# Patient Record
Sex: Male | Born: 1940
Health system: Southern US, Community
[De-identification: ages and names within clinical notes are randomized; demographics above are authoritative.]

## PROBLEM LIST (undated history)

## (undated) DIAGNOSIS — I10 Essential (primary) hypertension: Secondary | ICD-10-CM

---

## 2013-09-23 DIAGNOSIS — E785 Hyperlipidemia, unspecified: Secondary | ICD-10-CM | POA: Diagnosis not present

## 2013-09-23 DIAGNOSIS — I10 Essential (primary) hypertension: Secondary | ICD-10-CM | POA: Diagnosis not present

## 2013-12-17 DIAGNOSIS — L259 Unspecified contact dermatitis, unspecified cause: Secondary | ICD-10-CM | POA: Diagnosis not present

## 2013-12-17 DIAGNOSIS — I10 Essential (primary) hypertension: Secondary | ICD-10-CM | POA: Diagnosis not present

## 2013-12-17 DIAGNOSIS — K130 Diseases of lips: Secondary | ICD-10-CM | POA: Diagnosis not present

## 2014-03-18 DIAGNOSIS — M5412 Radiculopathy, cervical region: Secondary | ICD-10-CM | POA: Diagnosis not present

## 2014-04-01 DIAGNOSIS — H43819 Vitreous degeneration, unspecified eye: Secondary | ICD-10-CM | POA: Diagnosis not present

## 2014-04-01 DIAGNOSIS — H2589 Other age-related cataract: Secondary | ICD-10-CM | POA: Diagnosis not present

## 2014-06-06 DIAGNOSIS — Z23 Encounter for immunization: Secondary | ICD-10-CM | POA: Diagnosis not present

## 2014-06-06 DIAGNOSIS — K130 Diseases of lips: Secondary | ICD-10-CM | POA: Diagnosis not present

## 2014-11-14 DIAGNOSIS — I10 Essential (primary) hypertension: Secondary | ICD-10-CM | POA: Diagnosis not present

## 2014-11-14 DIAGNOSIS — Z6825 Body mass index (BMI) 25.0-25.9, adult: Secondary | ICD-10-CM | POA: Diagnosis not present

## 2014-11-14 DIAGNOSIS — Z125 Encounter for screening for malignant neoplasm of prostate: Secondary | ICD-10-CM | POA: Diagnosis not present

## 2014-11-14 DIAGNOSIS — E785 Hyperlipidemia, unspecified: Secondary | ICD-10-CM | POA: Diagnosis not present

## 2014-11-14 DIAGNOSIS — H6123 Impacted cerumen, bilateral: Secondary | ICD-10-CM | POA: Diagnosis not present

## 2014-11-14 DIAGNOSIS — Z131 Encounter for screening for diabetes mellitus: Secondary | ICD-10-CM | POA: Diagnosis not present

## 2014-11-14 DIAGNOSIS — Z1389 Encounter for screening for other disorder: Secondary | ICD-10-CM | POA: Diagnosis not present

## 2014-12-15 DIAGNOSIS — D225 Melanocytic nevi of trunk: Secondary | ICD-10-CM | POA: Diagnosis not present

## 2014-12-15 DIAGNOSIS — D1801 Hemangioma of skin and subcutaneous tissue: Secondary | ICD-10-CM | POA: Diagnosis not present

## 2014-12-15 DIAGNOSIS — L820 Inflamed seborrheic keratosis: Secondary | ICD-10-CM | POA: Diagnosis not present

## 2015-04-02 DIAGNOSIS — Z9181 History of falling: Secondary | ICD-10-CM | POA: Diagnosis not present

## 2015-04-02 DIAGNOSIS — Z23 Encounter for immunization: Secondary | ICD-10-CM | POA: Diagnosis not present

## 2015-04-02 DIAGNOSIS — Z6825 Body mass index (BMI) 25.0-25.9, adult: Secondary | ICD-10-CM | POA: Diagnosis not present

## 2015-04-02 DIAGNOSIS — Z125 Encounter for screening for malignant neoplasm of prostate: Secondary | ICD-10-CM | POA: Diagnosis not present

## 2015-04-02 DIAGNOSIS — E785 Hyperlipidemia, unspecified: Secondary | ICD-10-CM | POA: Diagnosis not present

## 2015-04-02 DIAGNOSIS — R5383 Other fatigue: Secondary | ICD-10-CM | POA: Diagnosis not present

## 2015-04-02 DIAGNOSIS — Z0000 Encounter for general adult medical examination without abnormal findings: Secondary | ICD-10-CM | POA: Diagnosis not present

## 2015-04-02 DIAGNOSIS — Z131 Encounter for screening for diabetes mellitus: Secondary | ICD-10-CM | POA: Diagnosis not present

## 2015-04-02 DIAGNOSIS — I10 Essential (primary) hypertension: Secondary | ICD-10-CM | POA: Diagnosis not present

## 2015-04-02 DIAGNOSIS — Z1389 Encounter for screening for other disorder: Secondary | ICD-10-CM | POA: Diagnosis not present

## 2015-04-02 DIAGNOSIS — K130 Diseases of lips: Secondary | ICD-10-CM | POA: Diagnosis not present

## 2015-04-02 DIAGNOSIS — R7309 Other abnormal glucose: Secondary | ICD-10-CM | POA: Diagnosis not present

## 2015-05-28 DIAGNOSIS — H04123 Dry eye syndrome of bilateral lacrimal glands: Secondary | ICD-10-CM | POA: Diagnosis not present

## 2015-05-28 DIAGNOSIS — H43813 Vitreous degeneration, bilateral: Secondary | ICD-10-CM | POA: Diagnosis not present

## 2015-05-28 DIAGNOSIS — H25813 Combined forms of age-related cataract, bilateral: Secondary | ICD-10-CM | POA: Diagnosis not present

## 2015-05-28 DIAGNOSIS — H16143 Punctate keratitis, bilateral: Secondary | ICD-10-CM | POA: Diagnosis not present

## 2015-06-16 DIAGNOSIS — I10 Essential (primary) hypertension: Secondary | ICD-10-CM | POA: Diagnosis not present

## 2015-06-16 DIAGNOSIS — R05 Cough: Secondary | ICD-10-CM | POA: Diagnosis not present

## 2015-07-21 DIAGNOSIS — H25813 Combined forms of age-related cataract, bilateral: Secondary | ICD-10-CM | POA: Diagnosis not present

## 2015-07-21 DIAGNOSIS — H04123 Dry eye syndrome of bilateral lacrimal glands: Secondary | ICD-10-CM | POA: Diagnosis not present

## 2015-07-21 DIAGNOSIS — H16143 Punctate keratitis, bilateral: Secondary | ICD-10-CM | POA: Diagnosis not present

## 2015-10-26 DIAGNOSIS — H16143 Punctate keratitis, bilateral: Secondary | ICD-10-CM | POA: Diagnosis not present

## 2015-10-26 DIAGNOSIS — H25813 Combined forms of age-related cataract, bilateral: Secondary | ICD-10-CM | POA: Diagnosis not present

## 2015-10-26 DIAGNOSIS — H04123 Dry eye syndrome of bilateral lacrimal glands: Secondary | ICD-10-CM | POA: Diagnosis not present

## 2015-12-08 DIAGNOSIS — E538 Deficiency of other specified B group vitamins: Secondary | ICD-10-CM | POA: Diagnosis not present

## 2015-12-17 DIAGNOSIS — R05 Cough: Secondary | ICD-10-CM | POA: Diagnosis not present

## 2015-12-17 DIAGNOSIS — Z6825 Body mass index (BMI) 25.0-25.9, adult: Secondary | ICD-10-CM | POA: Diagnosis not present

## 2015-12-17 DIAGNOSIS — I10 Essential (primary) hypertension: Secondary | ICD-10-CM | POA: Diagnosis not present

## 2016-01-06 DIAGNOSIS — J019 Acute sinusitis, unspecified: Secondary | ICD-10-CM | POA: Diagnosis not present

## 2016-01-06 DIAGNOSIS — Z6825 Body mass index (BMI) 25.0-25.9, adult: Secondary | ICD-10-CM | POA: Diagnosis not present

## 2016-01-26 DIAGNOSIS — Z6824 Body mass index (BMI) 24.0-24.9, adult: Secondary | ICD-10-CM | POA: Diagnosis not present

## 2016-01-26 DIAGNOSIS — R39198 Other difficulties with micturition: Secondary | ICD-10-CM | POA: Diagnosis not present

## 2016-01-26 DIAGNOSIS — J309 Allergic rhinitis, unspecified: Secondary | ICD-10-CM | POA: Diagnosis not present

## 2016-01-26 DIAGNOSIS — R05 Cough: Secondary | ICD-10-CM | POA: Diagnosis not present

## 2016-01-26 DIAGNOSIS — R42 Dizziness and giddiness: Secondary | ICD-10-CM | POA: Diagnosis not present

## 2016-04-05 DIAGNOSIS — M26621 Arthralgia of right temporomandibular joint: Secondary | ICD-10-CM | POA: Diagnosis not present

## 2016-04-05 DIAGNOSIS — H6123 Impacted cerumen, bilateral: Secondary | ICD-10-CM | POA: Diagnosis not present

## 2016-04-05 DIAGNOSIS — H9203 Otalgia, bilateral: Secondary | ICD-10-CM | POA: Diagnosis not present

## 2016-05-09 DIAGNOSIS — H43813 Vitreous degeneration, bilateral: Secondary | ICD-10-CM | POA: Diagnosis not present

## 2016-05-09 DIAGNOSIS — H04123 Dry eye syndrome of bilateral lacrimal glands: Secondary | ICD-10-CM | POA: Diagnosis not present

## 2016-05-09 DIAGNOSIS — H25813 Combined forms of age-related cataract, bilateral: Secondary | ICD-10-CM | POA: Diagnosis not present

## 2016-05-09 DIAGNOSIS — H16143 Punctate keratitis, bilateral: Secondary | ICD-10-CM | POA: Diagnosis not present

## 2016-09-08 DIAGNOSIS — Z23 Encounter for immunization: Secondary | ICD-10-CM | POA: Diagnosis not present

## 2016-11-10 DIAGNOSIS — H35033 Hypertensive retinopathy, bilateral: Secondary | ICD-10-CM | POA: Diagnosis not present

## 2016-11-10 DIAGNOSIS — H25813 Combined forms of age-related cataract, bilateral: Secondary | ICD-10-CM | POA: Diagnosis not present

## 2016-11-10 DIAGNOSIS — H16143 Punctate keratitis, bilateral: Secondary | ICD-10-CM | POA: Diagnosis not present

## 2016-11-10 DIAGNOSIS — H04123 Dry eye syndrome of bilateral lacrimal glands: Secondary | ICD-10-CM | POA: Diagnosis not present

## 2016-11-10 DIAGNOSIS — H43813 Vitreous degeneration, bilateral: Secondary | ICD-10-CM | POA: Diagnosis not present

## 2017-05-18 DIAGNOSIS — H16143 Punctate keratitis, bilateral: Secondary | ICD-10-CM | POA: Diagnosis not present

## 2017-05-18 DIAGNOSIS — H04123 Dry eye syndrome of bilateral lacrimal glands: Secondary | ICD-10-CM | POA: Diagnosis not present

## 2017-05-18 DIAGNOSIS — H25813 Combined forms of age-related cataract, bilateral: Secondary | ICD-10-CM | POA: Diagnosis not present

## 2017-05-18 DIAGNOSIS — H35033 Hypertensive retinopathy, bilateral: Secondary | ICD-10-CM | POA: Diagnosis not present

## 2017-05-18 DIAGNOSIS — H43813 Vitreous degeneration, bilateral: Secondary | ICD-10-CM | POA: Diagnosis not present

## 2017-06-09 DIAGNOSIS — H6123 Impacted cerumen, bilateral: Secondary | ICD-10-CM | POA: Diagnosis not present

## 2017-06-09 DIAGNOSIS — H6063 Unspecified chronic otitis externa, bilateral: Secondary | ICD-10-CM | POA: Diagnosis not present

## 2018-03-06 DIAGNOSIS — R05 Cough: Secondary | ICD-10-CM | POA: Diagnosis not present

## 2018-03-06 DIAGNOSIS — I10 Essential (primary) hypertension: Secondary | ICD-10-CM | POA: Diagnosis not present

## 2018-03-06 DIAGNOSIS — Z1339 Encounter for screening examination for other mental health and behavioral disorders: Secondary | ICD-10-CM | POA: Diagnosis not present

## 2018-03-06 DIAGNOSIS — N529 Male erectile dysfunction, unspecified: Secondary | ICD-10-CM | POA: Diagnosis not present

## 2018-03-06 DIAGNOSIS — Z23 Encounter for immunization: Secondary | ICD-10-CM | POA: Diagnosis not present

## 2018-03-06 DIAGNOSIS — Z1331 Encounter for screening for depression: Secondary | ICD-10-CM | POA: Diagnosis not present

## 2018-03-06 DIAGNOSIS — E785 Hyperlipidemia, unspecified: Secondary | ICD-10-CM | POA: Diagnosis not present

## 2018-03-17 ENCOUNTER — Other Ambulatory Visit: Payer: Self-pay

## 2018-03-17 ENCOUNTER — Encounter: Payer: Self-pay | Admitting: Emergency Medicine

## 2018-03-17 ENCOUNTER — Emergency Department
Admission: EM | Admit: 2018-03-17 | Discharge: 2018-03-17 | Disposition: A | Discharge status: Home / Self Care | Payer: Medicare Other | Attending: Emergency Medicine | Admitting: Emergency Medicine

## 2018-03-17 DIAGNOSIS — H6122 Impacted cerumen, left ear: Secondary | ICD-10-CM | POA: Insufficient documentation

## 2018-03-17 DIAGNOSIS — H9202 Otalgia, left ear: Secondary | ICD-10-CM | POA: Insufficient documentation

## 2018-03-17 MED ORDER — AMOXICILLIN-POT CLAVULANATE 875-125 MG PO TABS: 1.0000 | ORAL_TABLET | Freq: Two times a day (BID) | ORAL | 0 refills | Status: AC

## 2018-03-17 MED ORDER — CIPROFLOXACIN-DEXAMETHASONE 0.3-0.1 % OT SUSP: 4.0000 [drp] | Freq: Two times a day (BID) | OTIC | 0 refills | Status: AC

## 2018-03-17 NOTE — ED Triage Notes (Signed)
L earache x 5 days. Removed ear wax at home.

## 2018-03-17 NOTE — ED Provider Notes (Signed)
[facility] Emergency Department Provider Note  ____________________________________________  Time seen: Approximately 3:41 PM  I have reviewed the triage vital signs and the nursing notes.   HISTORY  Chief Complaint Otalgia    HPI Bradley Charles is a 77 y.o. male presents emergency department for evaluation of left ear pain for 5 days.  Patient has been trying to flush the ear out with water pressure.  His wife has tried to pull the wax out of his ear.  He has applied over-the-counter peroxide drops to left ear.  He seems to be making symptoms worse.  He is not sure if he injured his ear during these attempts.  He has also been congested for about 2 weeks.  No known fevers.  No cough, shortness breath.  History reviewed. No pertinent past medical history.  There are no active problems to display for this patient.   History reviewed. No pertinent surgical history.  Prior to Admission medications   Medication Sig Start Date End Date Taking? Authorizing Provider  amoxicillin-clavulanate (AUGMENTIN) 875-125 MG tablet Take 1 tablet by mouth 2 (two) times daily for 10 days. 03/17/18 03/27/18  Laban Emperor, PA-C  ciprofloxacin-dexamethasone (CIPRODEX) OTIC suspension Place 4 drops into the left ear 2 (two) times daily. 03/17/18   Laban Emperor, PA-C    Allergies Patient has no known allergies.  No family history on file.  Social History Social History   Tobacco Use  . Smoking status: Never Smoker  Substance Use Topics  . Alcohol use: Not on file  . Drug use: Not on file     Review of Systems  Constitutional: No fever/chills ENT: Positive for ear pain.  Respiratory: No cough. No SOB. Musculoskeletal: Negative for musculoskeletal pain. Skin: Negative for rash, abrasions, lacerations, ecchymosis. Neurological: Negative for headaches   ____________________________________________   PHYSICAL EXAM:  VITAL SIGNS: ED Triage Vitals  Enc Vitals  Group     BP 03/17/18 1123 126/87     Pulse Rate 03/17/18 1123 60     Resp 03/17/18 1123 18     Temp 03/17/18 1123 97.8 F (36.6 C)     Temp Source 03/17/18 1123 Oral     SpO2 03/17/18 1123 96 %     Weight 03/17/18 1124 168 lb (76.2 kg)     Height 03/17/18 1124 5\' 10"  (1.778 m)     Head Circumference --      Peak Flow --      Pain Score 03/17/18 1124 5     Pain Loc --      Pain Edu? --      Excl. in [place]? --      Constitutional: Alert and oriented. Well appearing and in no acute distress. Eyes: Conjunctivae are normal. PERRL. EOMI. Head: Atraumatic. ENT:      Ears: Tenderness to palpation of left pinna and tragus.  Moderate amount of wax and white discharge to left ear canal.  Unable to visualize left tympanic membrane.       Nose: Mild congestion/rhinnorhea.      Mouth/Throat: Mucous membranes are moist.  Neck: No stridor.   Cardiovascular: Normal rate, regular rhythm.  Good peripheral circulation. Respiratory: Normal respiratory effort without tachypnea or retractions. Lungs CTAB. Good air entry to the bases with no decreased or absent breath sounds. Musculoskeletal: Full range of motion to all extremities. No gross deformities appreciated. Neurologic:  Normal speech and language. No gross focal neurologic deficits are appreciated.  Skin:  Skin is warm, dry and  intact. No rash noted. Psychiatric: Mood and affect are normal. Speech and behavior are normal. Patient exhibits appropriate insight and judgement.   ____________________________________________   LABS (all labs ordered are listed, but only abnormal results are displayed)  Labs Reviewed - No data to display ____________________________________________  EKG   ____________________________________________  RADIOLOGY  No results found.  ____________________________________________    PROCEDURES  Procedure(s) performed:    .Ear Cerumen Removal Date/Time: 03/17/2018 3:46 PM Performed by: Laban Emperor,  PA-C Authorized by: Laban Emperor, PA-C   Consent:    Consent obtained:  Verbal   Consent given by:  Patient   Risks discussed:  Bleeding, pain, dizziness and incomplete removal   Alternatives discussed:  No treatment Procedure details:    Location:  L ear   Procedure type: curette   Post-procedure details:    Patient tolerance of procedure:  Tolerated with difficulty      Medications - No data to display   ____________________________________________   INITIAL IMPRESSION / ASSESSMENT AND PLAN / ED COURSE  Pertinent labs & imaging results that were available during my care of the patient were reviewed by me and considered in my medical decision making (see chart for details).  Review of the [place] CSRS was performed in accordance of the [place] prior to dispensing any controlled drugs.   Patient presented to the emergency department for evaluation of left ear pain and pressure for 5 days.  Patient has been attempting wax removal and is unsure of trauma.  I am unable to visualize the tympanic membrane.  I attempted to remove additional wax and discharge but procedure was stopped due to pain. Patient will be covered for infection. Patient will be discharged home with prescriptions for Ciprodex and Augmentin. Patient is to follow up with ENT as directed. Patient is given ED precautions to return to the ED for any worsening or new symptoms.     ____________________________________________  FINAL CLINICAL IMPRESSION(S) / ED DIAGNOSES  Final diagnoses:  Otalgia of left ear      NEW MEDICATIONS STARTED DURING THIS VISIT:  ED Discharge Orders        Ordered    amoxicillin-clavulanate (AUGMENTIN) 875-125 MG tablet  2 times daily     03/17/18 1325    ciprofloxacin-dexamethasone (CIPRODEX) OTIC suspension  2 times daily     03/17/18 1325          This chart was dictated using voice recognition software/Dragon. Despite best efforts to proofread, errors can occur which can  change the meaning. Any change was purely unintentional.    Laban Emperor, PA-C 03/17/18 Osgood, Randall An, MD 03/17/18 [phone]

## 2018-03-17 NOTE — ED Notes (Signed)
Patient presents to the ED with left ear pressure since Monday.  Patient states he removed a significant amount of wax and has been using ear drops but pain has increased significantly since yesterday. Patient has attempted to squirt water in his ear as well.  Patient also accidentally placed his ear drops in his eye which he said was also painful.  Patient states his hearing is significantly reduced in his left ear as well.

## 2018-03-20 DIAGNOSIS — H60392 Other infective otitis externa, left ear: Secondary | ICD-10-CM | POA: Diagnosis not present

## 2018-03-20 DIAGNOSIS — H9202 Otalgia, left ear: Secondary | ICD-10-CM | POA: Diagnosis not present

## 2018-03-26 DIAGNOSIS — H60399 Other infective otitis externa, unspecified ear: Secondary | ICD-10-CM | POA: Diagnosis not present

## 2018-03-26 DIAGNOSIS — H9212 Otorrhea, left ear: Secondary | ICD-10-CM | POA: Diagnosis not present

## 2018-03-30 ENCOUNTER — Other Ambulatory Visit: Payer: Self-pay

## 2018-03-30 DIAGNOSIS — J301 Allergic rhinitis due to pollen: Secondary | ICD-10-CM | POA: Diagnosis not present

## 2018-03-30 DIAGNOSIS — H60392 Other infective otitis externa, left ear: Secondary | ICD-10-CM | POA: Diagnosis not present

## 2018-04-29 DIAGNOSIS — I10 Essential (primary) hypertension: Secondary | ICD-10-CM | POA: Diagnosis not present

## 2018-04-29 DIAGNOSIS — R069 Unspecified abnormalities of breathing: Secondary | ICD-10-CM | POA: Diagnosis not present

## 2018-04-29 DIAGNOSIS — S025XXA Fracture of tooth (traumatic), initial encounter for closed fracture: Secondary | ICD-10-CM | POA: Diagnosis not present

## 2018-06-13 DIAGNOSIS — H6063 Unspecified chronic otitis externa, bilateral: Secondary | ICD-10-CM | POA: Diagnosis not present

## 2018-09-07 DIAGNOSIS — E785 Hyperlipidemia, unspecified: Secondary | ICD-10-CM | POA: Diagnosis not present

## 2018-09-07 DIAGNOSIS — I10 Essential (primary) hypertension: Secondary | ICD-10-CM | POA: Diagnosis not present

## 2018-09-07 DIAGNOSIS — Z6824 Body mass index (BMI) 24.0-24.9, adult: Secondary | ICD-10-CM | POA: Diagnosis not present

## 2018-09-07 DIAGNOSIS — N529 Male erectile dysfunction, unspecified: Secondary | ICD-10-CM | POA: Diagnosis not present

## 2018-09-25 DIAGNOSIS — H60391 Other infective otitis externa, right ear: Secondary | ICD-10-CM | POA: Diagnosis not present

## 2018-09-25 DIAGNOSIS — H6122 Impacted cerumen, left ear: Secondary | ICD-10-CM | POA: Diagnosis not present

## 2018-09-25 DIAGNOSIS — H60331 Swimmer's ear, right ear: Secondary | ICD-10-CM | POA: Diagnosis not present

## 2018-09-27 DIAGNOSIS — H60339 Swimmer's ear, unspecified ear: Secondary | ICD-10-CM | POA: Diagnosis not present

## 2018-09-27 DIAGNOSIS — H60391 Other infective otitis externa, right ear: Secondary | ICD-10-CM | POA: Diagnosis not present

## 2018-10-02 DIAGNOSIS — H60339 Swimmer's ear, unspecified ear: Secondary | ICD-10-CM | POA: Diagnosis not present

## 2018-10-02 DIAGNOSIS — H60391 Other infective otitis externa, right ear: Secondary | ICD-10-CM | POA: Diagnosis not present

## 2018-10-10 DIAGNOSIS — H60331 Swimmer's ear, right ear: Secondary | ICD-10-CM | POA: Diagnosis not present

## 2018-10-10 DIAGNOSIS — H60391 Other infective otitis externa, right ear: Secondary | ICD-10-CM | POA: Diagnosis not present

## 2019-03-14 DIAGNOSIS — I10 Essential (primary) hypertension: Secondary | ICD-10-CM | POA: Diagnosis not present

## 2019-03-14 DIAGNOSIS — Z6823 Body mass index (BMI) 23.0-23.9, adult: Secondary | ICD-10-CM | POA: Diagnosis not present

## 2019-03-14 DIAGNOSIS — H6060 Unspecified chronic otitis externa, unspecified ear: Secondary | ICD-10-CM | POA: Diagnosis not present

## 2019-03-14 DIAGNOSIS — Z1331 Encounter for screening for depression: Secondary | ICD-10-CM | POA: Diagnosis not present

## 2019-03-14 DIAGNOSIS — N529 Male erectile dysfunction, unspecified: Secondary | ICD-10-CM | POA: Diagnosis not present

## 2019-03-14 DIAGNOSIS — Z139 Encounter for screening, unspecified: Secondary | ICD-10-CM | POA: Diagnosis not present

## 2019-03-14 DIAGNOSIS — E785 Hyperlipidemia, unspecified: Secondary | ICD-10-CM | POA: Diagnosis not present

## 2019-03-14 DIAGNOSIS — H6121 Impacted cerumen, right ear: Secondary | ICD-10-CM | POA: Diagnosis not present

## 2019-05-31 DIAGNOSIS — Z20828 Contact with and (suspected) exposure to other viral communicable diseases: Secondary | ICD-10-CM | POA: Diagnosis not present

## 2019-06-19 DIAGNOSIS — Z23 Encounter for immunization: Secondary | ICD-10-CM | POA: Diagnosis not present

## 2019-06-19 DIAGNOSIS — R001 Bradycardia, unspecified: Secondary | ICD-10-CM | POA: Diagnosis not present

## 2019-06-19 DIAGNOSIS — I10 Essential (primary) hypertension: Secondary | ICD-10-CM | POA: Diagnosis not present

## 2019-06-19 DIAGNOSIS — R42 Dizziness and giddiness: Secondary | ICD-10-CM | POA: Diagnosis not present

## 2019-06-19 DIAGNOSIS — R202 Paresthesia of skin: Secondary | ICD-10-CM | POA: Diagnosis not present

## 2020-01-21 DIAGNOSIS — R208 Other disturbances of skin sensation: Secondary | ICD-10-CM | POA: Diagnosis not present

## 2020-01-21 DIAGNOSIS — N529 Male erectile dysfunction, unspecified: Secondary | ICD-10-CM | POA: Diagnosis not present

## 2020-01-21 DIAGNOSIS — E785 Hyperlipidemia, unspecified: Secondary | ICD-10-CM | POA: Diagnosis not present

## 2020-01-21 DIAGNOSIS — I10 Essential (primary) hypertension: Secondary | ICD-10-CM | POA: Diagnosis not present

## 2020-06-01 DIAGNOSIS — Z23 Encounter for immunization: Secondary | ICD-10-CM | POA: Diagnosis not present

## 2020-07-26 DIAGNOSIS — Z20822 Contact with and (suspected) exposure to covid-19: Secondary | ICD-10-CM | POA: Diagnosis not present

## 2021-01-19 DIAGNOSIS — Z23 Encounter for immunization: Secondary | ICD-10-CM | POA: Diagnosis not present

## 2021-01-28 ENCOUNTER — Encounter: Payer: Self-pay | Admitting: Emergency Medicine

## 2021-01-28 ENCOUNTER — Emergency Department: Payer: Medicare Other

## 2021-01-28 ENCOUNTER — Other Ambulatory Visit: Payer: Self-pay

## 2021-01-28 ENCOUNTER — Emergency Department
Admission: EM | Admit: 2021-01-28 | Discharge: 2021-01-28 | Disposition: A | Discharge status: Home / Self Care | Payer: Medicare Other | Attending: Emergency Medicine | Admitting: Emergency Medicine

## 2021-01-28 DIAGNOSIS — J8410 Pulmonary fibrosis, unspecified: Secondary | ICD-10-CM | POA: Diagnosis not present

## 2021-01-28 DIAGNOSIS — R0602 Shortness of breath: Secondary | ICD-10-CM | POA: Diagnosis not present

## 2021-01-28 DIAGNOSIS — R059 Cough, unspecified: Secondary | ICD-10-CM | POA: Diagnosis present

## 2021-01-28 DIAGNOSIS — U071 COVID-19: Secondary | ICD-10-CM | POA: Diagnosis not present

## 2021-01-28 DIAGNOSIS — I10 Essential (primary) hypertension: Secondary | ICD-10-CM | POA: Insufficient documentation

## 2021-01-28 DIAGNOSIS — R0902 Hypoxemia: Secondary | ICD-10-CM | POA: Diagnosis not present

## 2021-01-28 HISTORY — DX: Essential (primary) hypertension: I10

## 2021-01-28 MED ORDER — NIRMATRELVIR/RITONAVIR (PAXLOVID)TABLET: 3.0000 | ORAL_TABLET | Freq: Two times a day (BID) | ORAL | 0 refills | Status: AC

## 2021-01-28 NOTE — Discharge Instructions (Addendum)
For your breathing:  Try to stay upright as often as possible - lying flat or reclining is bad for your lungs with COVID  Start the Paxlovid as prescribed  Mont[place] your HR/pulse ox at home. If it is in the low 90s, rest, take deep breaths, and try clearing your lungs with coughing. If it remains <88%, return to the ER.

## 2021-01-28 NOTE — ED Notes (Signed)
Pt ambulatory in hall with steady gait. Pt maintained oxygen saturation of 97-99% while ambulating and denies any dyspnea at this time.

## 2021-01-28 NOTE — ED Triage Notes (Signed)
Pt comes into the ED via POV c/o being COVID positive and he noticed his O2 levels at home were dropping to 88%.  Pt denies any SHOB or feeling bad.  Pt ambulatory to triage at this time and in NAD.

## 2021-01-28 NOTE — ED Provider Notes (Signed)
[facility] Emergency Department Provider Note  ____________________________________________   Event Date/Time   First MD Initiated Contact with Patient 01/28/21 1538     (approximate)  I have reviewed the triage vital signs and the nursing notes.   HISTORY  Chief Complaint Shortness of Breath    HPI Bradley Charles is a 80 y.o. male  With h/o HTN here for evaluation. Pt recently started having cough, SOB for 2-3 days. Reports he was diagnosed with COVID at home, has been treating supportively. Son has [place] as well. Reports that earlier today, his wife checked his pulse ox and it was as low as 88, so she wanted him to come. Reports he actually feels somewhat better today. He has had cough, subjective fevers, congestion. No SOB. No CP. No leg swelling. He has been vaccinated with 2 boosters.        Past Medical History:  Diagnosis Date  . Hypertension     There are no problems to display for this patient.   History reviewed. No pertinent surgical history.  Prior to Admission medications   Medication Sig Start Date End Date Taking? Authorizing Provider  nirmatrelvir/ritonavir EUA (PAXLOVID) TABS Take 3 tablets by mouth 2 (two) times daily for 5 days. Patient GFR is normal. Take nirmatrelvir (150 mg) two tablets twice daily for 5 days and ritonavir (100 mg) one tablet twice daily for 5 days. 01/28/21 02/02/21 Yes Duffy Bruce, MD  ciprofloxacin-dexamethasone (CIPRODEX) OTIC suspension Place 4 drops into the left ear 2 (two) times daily. 03/17/18   Laban Emperor, PA-C    Allergies Patient has no known allergies.  History reviewed. No pertinent family history.  Social History Social History   Tobacco Use  . Smoking status: Never Smoker  . Smokeless tobacco: Never Used  Substance Use Topics  . Alcohol use: Not Currently  . Drug use: Not Currently    Review of Systems  Review of Systems  Constitutional: Negative for chills, fatigue and  fever.  HENT: Positive for congestion. Negative for sore throat.   Respiratory: Positive for cough. Negative for shortness of breath.   Cardiovascular: Negative for chest pain.  Gastrointestinal: Negative for abdominal pain.  Genitourinary: Negative for flank pain.  Musculoskeletal: Negative for neck pain.  Skin: Negative for rash and wound.  Allergic/Immunologic: Negative for immunocompromised state.  Neurological: Negative for weakness and numbness.  Hematological: Does not bruise/bleed easily.  All other systems reviewed and are negative.    ____________________________________________  PHYSICAL EXAM:      VITAL SIGNS: ED Triage Vitals  Enc Vitals Group     BP 01/28/21 1351 119/72     Pulse Rate 01/28/21 1351 76     Resp 01/28/21 1351 18     Temp 01/28/21 1351 98.4 F (36.9 C)     Temp Source 01/28/21 1351 Oral     SpO2 01/28/21 1351 96 %     Weight 01/28/21 1348 160 lb (72.6 kg)     Height 01/28/21 1348 5\' 11"  (1.803 m)     Head Circumference --      Peak Flow --      Pain Score 01/28/21 1348 0     Pain Loc --      Pain Edu? --      Excl. in [place]? --      Physical Exam Vitals and nursing note reviewed.  Constitutional:      General: He is not in acute distress.    Appearance: He is well-developed.  HENT:     Head: Normocephalic and atraumatic.  Eyes:     Conjunctiva/sclera: Conjunctivae normal.  Cardiovascular:     Rate and Rhythm: Normal rate and regular rhythm.     Heart sounds: Normal heart sounds. No murmur heard. No friction rub.  Pulmonary:     Effort: Pulmonary effort is normal. No respiratory distress.     Breath sounds: Normal breath sounds. No wheezing or rales.  Abdominal:     General: There is no distension.     Palpations: Abdomen is soft.     Tenderness: There is no abdominal tenderness.  Musculoskeletal:     Cervical back: Neck supple.  Skin:    General: Skin is warm.     Capillary Refill: Capillary refill takes less than 2 seconds.   Neurological:     Mental Status: He is alert and oriented to person, place, and time.     Motor: No abnormal muscle tone.       ____________________________________________   LABS (all labs ordered are listed, but only abnormal results are displayed)  Labs Reviewed - No data to display  ____________________________________________  EKG: Normal sinus rhythm, ventricular rate 80.  PR 150, QRS 80, QTc 399.  No acute ST elevations or depressions.  No acute evidence of acute ischemia or infarct. ________________________________________  RADIOLOGY All imaging, including plain films, CT scans, and ultrasounds, independently reviewed by me, and interpretations confirmed via formal radiology reads.  ED MD interpretation:   Chest x-ray: Clear, no focal abnormality, no pneumonia  Official radiology report(s): DG Chest 1 View  Result Date: 01/28/2021 CLINICAL DATA:  Repeat AP chest to rule out pneumothorax. EXAM: CHEST  1 VIEW COMPARISON:  Radiographs earlier today. FINDINGS: No left pneumothorax. Particularly, the lucency at the left lung base on prior exam is no longer seen. There is a calcified granuloma in the left lower lobe. The lungs are otherwise clear. Heart is normal in size with stable mediastinal contours. No pleural fluid. IMPRESSION: 1. No pneumothorax. Particularly, the lucency at the left lung base on prior exam is no longer seen. 2. Clear lungs. Electronically Signed   By: Keith Rake M.D.   On: 01/28/2021 15:36   DG Chest 2 View  Result Date: 01/28/2021 CLINICAL DATA:  Shortness of breath, hypoxia EXAM: CHEST - 2 VIEW COMPARISON:  None. FINDINGS: The heart size and mediastinal contours are within normal limits. Atherosclerotic calcification of the aortic knob. Thin linear marking at the periphery of the left lung base which appears to have lung markings extending more peripherally suggesting artifact. No focal airspace consolidation. No pleural effusion. No acute osseous  findings. IMPRESSION: 1. Thin linear marking at the periphery of the left lung base which appears to have lung markings extending more peripherally suggesting artifact. Consider repeat frontal chest x-ray after patient repositioning to exclude a small pneumothorax. 2. Otherwise, no acute findings. Electronically Signed   By: Davina Poke D.O.   On: 01/28/2021 14:17    ____________________________________________  PROCEDURES   Procedure(s) performed (including Critical Care):  Procedures  ____________________________________________  INITIAL IMPRESSION / MDM / [place] / ED COURSE  As part of my medical decision making, I reviewed the following data within the [place] notes reviewed and incorporated, Old chart reviewed, Notes from prior ED visits, and [place] Controlled Substance Database       *Elsie Sakuma was evaluated in Emergency Department on 01/28/2021 for the symptoms described in the history of present illness. He  was evaluated in the context of the global COVID-19 pandemic, which necessitated consideration that the patient might be at risk for infection with the SARS-CoV-2 virus that causes COVID-19. Institutional protocols and algorithms that pertain to the evaluation of patients at risk for COVID-19 are in a state of rapid change based on information released by regulatory bodies including the CDC and federal and state organizations. These policies and algorithms were followed during the patient's care in the ED.  Some ED evaluations and interventions may be delayed as a result of limited staffing during the pandemic.*     Medical Decision Making: 80 year old male here with COVID-19.  Patient was diagnosed at home and has been recovering fairly well.  He actually feels better today.  Chest x-ray reviewed, shows no evidence of pneumonia.  He is not hypoxic here and is ambulatory in the ED without any difficulty.  No high risk features other  than age.  No history of lung disease.  Discussed that his transient hypoxia could have been positional, versus artifact just that he was measuring it on the phone.  Given absence of any hypoxia here with monitoring and ambulation without any difficulty, feels reasonable to continue treatment as an outpatient.  Will prescribe Paxil bid.  Return precautions given in detail.  ____________________________________________  FINAL CLINICAL IMPRESSION(S) / ED DIAGNOSES  Final diagnoses:  COVID-19     MEDICATIONS GIVEN DURING THIS VISIT:  Medications - No data to display   ED Discharge Orders         Ordered    nirmatrelvir/ritonavir EUA (PAXLOVID) TABS  2 times daily        01/28/21 1627           Note:  This document was prepared using Dragon voice recognition software and may include unintentional dictation errors.   Duffy Bruce, MD 01/28/21 [phone]

## 2021-01-28 NOTE — ED Notes (Signed)
Called x 1 no answer

## 2021-09-17 DIAGNOSIS — G3184 Mild cognitive impairment, so stated: Secondary | ICD-10-CM | POA: Diagnosis not present

## 2021-09-17 DIAGNOSIS — Z9181 History of falling: Secondary | ICD-10-CM | POA: Diagnosis not present

## 2021-09-17 DIAGNOSIS — Z1331 Encounter for screening for depression: Secondary | ICD-10-CM | POA: Diagnosis not present

## 2021-09-17 DIAGNOSIS — Z139 Encounter for screening, unspecified: Secondary | ICD-10-CM | POA: Diagnosis not present

## 2021-09-17 DIAGNOSIS — E785 Hyperlipidemia, unspecified: Secondary | ICD-10-CM | POA: Diagnosis not present

## 2021-09-17 DIAGNOSIS — Z79899 Other long term (current) drug therapy: Secondary | ICD-10-CM | POA: Diagnosis not present

## 2021-09-17 DIAGNOSIS — L821 Other seborrheic keratosis: Secondary | ICD-10-CM | POA: Diagnosis not present

## 2021-09-17 DIAGNOSIS — R609 Edema, unspecified: Secondary | ICD-10-CM | POA: Diagnosis not present

## 2021-09-17 DIAGNOSIS — I10 Essential (primary) hypertension: Secondary | ICD-10-CM | POA: Diagnosis not present

## 2021-10-25 DIAGNOSIS — Z6823 Body mass index (BMI) 23.0-23.9, adult: Secondary | ICD-10-CM | POA: Diagnosis not present

## 2021-10-25 DIAGNOSIS — J209 Acute bronchitis, unspecified: Secondary | ICD-10-CM | POA: Diagnosis not present

## 2021-10-28 DIAGNOSIS — H25812 Combined forms of age-related cataract, left eye: Secondary | ICD-10-CM | POA: Diagnosis not present

## 2021-12-13 IMAGING — CR DG CHEST 1V
1 series · 1 of 1 positions shown · non-contrast
Comparison: Radiographs earlier today.

CLINICAL DATA: Repeat AP chest to rule out pneumothorax.

EXAM:
CHEST  1 VIEW

[dg chest 1 view]
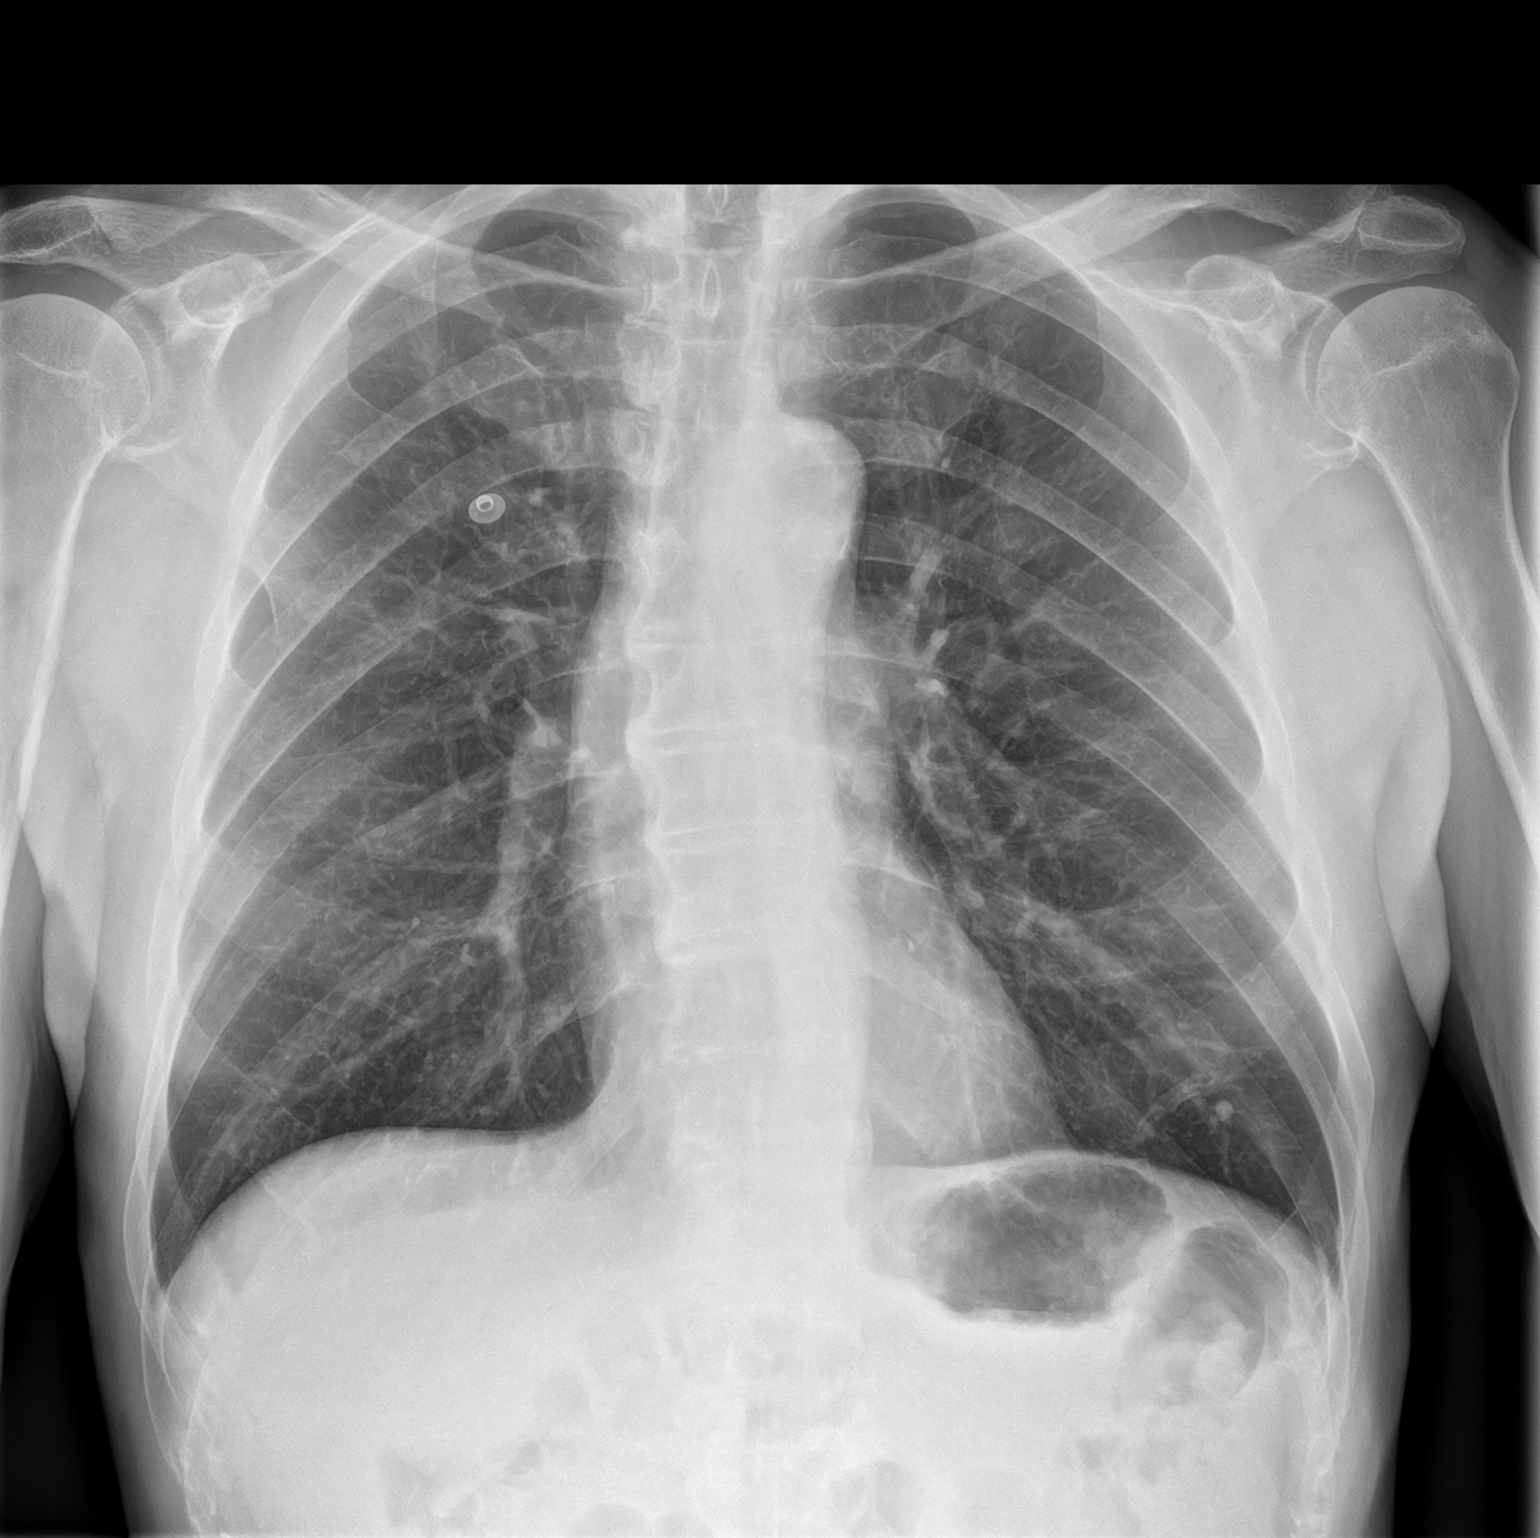

[1 of 1 positions shown; findings below may reference images not displayed]

FINDINGS: No left pneumothorax. Particularly, the lucency at the left lung
base on prior exam is no longer seen. There is a calcified granuloma
in the left lower lobe. The lungs are otherwise clear. Heart is
normal in size with stable mediastinal contours. No pleural fluid.
IMPRESSION: 1. No pneumothorax. Particularly, the lucency at the left lung base
on prior exam is no longer seen.
2. Clear lungs.

## 2022-02-01 DIAGNOSIS — T148XXA Other injury of unspecified body region, initial encounter: Secondary | ICD-10-CM | POA: Diagnosis not present

## 2022-02-01 DIAGNOSIS — Z6824 Body mass index (BMI) 24.0-24.9, adult: Secondary | ICD-10-CM | POA: Diagnosis not present

## 2022-03-14 DIAGNOSIS — L578 Other skin changes due to chronic exposure to nonionizing radiation: Secondary | ICD-10-CM | POA: Diagnosis not present

## 2022-03-14 DIAGNOSIS — L570 Actinic keratosis: Secondary | ICD-10-CM | POA: Diagnosis not present

## 2022-03-14 DIAGNOSIS — L821 Other seborrheic keratosis: Secondary | ICD-10-CM | POA: Diagnosis not present

## 2022-03-14 DIAGNOSIS — C4442 Squamous cell carcinoma of skin of scalp and neck: Secondary | ICD-10-CM | POA: Diagnosis not present

## 2022-03-17 DIAGNOSIS — R059 Cough, unspecified: Secondary | ICD-10-CM | POA: Diagnosis not present

## 2022-03-17 DIAGNOSIS — Z79899 Other long term (current) drug therapy: Secondary | ICD-10-CM | POA: Diagnosis not present

## 2022-03-17 DIAGNOSIS — I10 Essential (primary) hypertension: Secondary | ICD-10-CM | POA: Diagnosis not present

## 2022-03-17 DIAGNOSIS — G3184 Mild cognitive impairment, so stated: Secondary | ICD-10-CM | POA: Diagnosis not present

## 2022-03-17 DIAGNOSIS — R609 Edema, unspecified: Secondary | ICD-10-CM | POA: Diagnosis not present

## 2022-04-14 DIAGNOSIS — C4442 Squamous cell carcinoma of skin of scalp and neck: Secondary | ICD-10-CM | POA: Diagnosis not present

## 2022-05-20 DIAGNOSIS — Z23 Encounter for immunization: Secondary | ICD-10-CM | POA: Diagnosis not present

## 2022-09-09 DIAGNOSIS — K1370 Unspecified lesions of oral mucosa: Secondary | ICD-10-CM | POA: Diagnosis not present

## 2022-09-09 DIAGNOSIS — Z6824 Body mass index (BMI) 24.0-24.9, adult: Secondary | ICD-10-CM | POA: Diagnosis not present

## 2022-10-18 DIAGNOSIS — C4442 Squamous cell carcinoma of skin of scalp and neck: Secondary | ICD-10-CM | POA: Diagnosis not present

## 2022-10-18 DIAGNOSIS — L578 Other skin changes due to chronic exposure to nonionizing radiation: Secondary | ICD-10-CM | POA: Diagnosis not present

## 2022-10-18 DIAGNOSIS — L820 Inflamed seborrheic keratosis: Secondary | ICD-10-CM | POA: Diagnosis not present

## 2022-10-18 DIAGNOSIS — L821 Other seborrheic keratosis: Secondary | ICD-10-CM | POA: Diagnosis not present

## 2022-10-18 DIAGNOSIS — L570 Actinic keratosis: Secondary | ICD-10-CM | POA: Diagnosis not present

## 2023-01-16 DIAGNOSIS — Z1331 Encounter for screening for depression: Secondary | ICD-10-CM | POA: Diagnosis not present

## 2023-01-16 DIAGNOSIS — R32 Unspecified urinary incontinence: Secondary | ICD-10-CM | POA: Diagnosis not present

## 2023-01-16 DIAGNOSIS — R609 Edema, unspecified: Secondary | ICD-10-CM | POA: Diagnosis not present

## 2023-01-16 DIAGNOSIS — I10 Essential (primary) hypertension: Secondary | ICD-10-CM | POA: Diagnosis not present

## 2023-01-16 DIAGNOSIS — Z6824 Body mass index (BMI) 24.0-24.9, adult: Secondary | ICD-10-CM | POA: Diagnosis not present

## 2023-01-16 DIAGNOSIS — Z79899 Other long term (current) drug therapy: Secondary | ICD-10-CM | POA: Diagnosis not present

## 2023-01-16 DIAGNOSIS — H6123 Impacted cerumen, bilateral: Secondary | ICD-10-CM | POA: Diagnosis not present

## 2023-01-16 DIAGNOSIS — G3184 Mild cognitive impairment, so stated: Secondary | ICD-10-CM | POA: Diagnosis not present

## 2023-01-16 DIAGNOSIS — Z9181 History of falling: Secondary | ICD-10-CM | POA: Diagnosis not present

## 2023-01-16 DIAGNOSIS — Z139 Encounter for screening, unspecified: Secondary | ICD-10-CM | POA: Diagnosis not present

## 2023-02-28 DIAGNOSIS — H6123 Impacted cerumen, bilateral: Secondary | ICD-10-CM | POA: Diagnosis not present

## 2023-02-28 DIAGNOSIS — H902 Conductive hearing loss, unspecified: Secondary | ICD-10-CM | POA: Diagnosis not present

## 2023-04-30 ENCOUNTER — Emergency Department
Admission: EM | Admit: 2023-04-30 | Discharge: 2023-04-30 | Disposition: A | Discharge status: Home / Self Care | Payer: Medicare Other | Attending: Student in an Organized Health Care Education/Training Program | Admitting: Student in an Organized Health Care Education/Training Program

## 2023-04-30 ENCOUNTER — Emergency Department: Payer: Medicare Other

## 2023-04-30 ENCOUNTER — Encounter: Payer: Self-pay | Admitting: Emergency Medicine

## 2023-04-30 ENCOUNTER — Other Ambulatory Visit: Payer: Self-pay

## 2023-04-30 DIAGNOSIS — M5030 Other cervical disc degeneration, unspecified cervical region: Secondary | ICD-10-CM | POA: Diagnosis not present

## 2023-04-30 DIAGNOSIS — S0990XA Unspecified injury of head, initial encounter: Secondary | ICD-10-CM | POA: Diagnosis not present

## 2023-04-30 DIAGNOSIS — R519 Headache, unspecified: Secondary | ICD-10-CM | POA: Diagnosis not present

## 2023-04-30 DIAGNOSIS — M79645 Pain in left finger(s): Secondary | ICD-10-CM | POA: Diagnosis not present

## 2023-04-30 DIAGNOSIS — I10 Essential (primary) hypertension: Secondary | ICD-10-CM | POA: Diagnosis not present

## 2023-04-30 DIAGNOSIS — S161XXA Strain of muscle, fascia and tendon at neck level, initial encounter: Secondary | ICD-10-CM | POA: Insufficient documentation

## 2023-04-30 DIAGNOSIS — W228XXA Striking against or struck by other objects, initial encounter: Secondary | ICD-10-CM | POA: Diagnosis not present

## 2023-04-30 DIAGNOSIS — G319 Degenerative disease of nervous system, unspecified: Secondary | ICD-10-CM | POA: Diagnosis not present

## 2023-04-30 DIAGNOSIS — M79644 Pain in right finger(s): Secondary | ICD-10-CM | POA: Insufficient documentation

## 2023-04-30 DIAGNOSIS — M542 Cervicalgia: Secondary | ICD-10-CM | POA: Diagnosis present

## 2023-04-30 DIAGNOSIS — R9089 Other abnormal findings on diagnostic imaging of central nervous system: Secondary | ICD-10-CM | POA: Diagnosis not present

## 2023-04-30 MED ORDER — MELOXICAM 15 MG PO TABS: 15.0000 mg | ORAL_TABLET | Freq: Every day | ORAL | 0 refills | Status: AC

## 2023-04-30 MED ORDER — CYCLOBENZAPRINE HCL 10 MG PO TABS: 10.0000 mg | ORAL_TABLET | Freq: Three times a day (TID) | ORAL | 0 refills | Status: AC | PRN

## 2023-04-30 MED ORDER — HYDROCODONE-ACETAMINOPHEN 5-325 MG PO TABS
1.0000 | ORAL_TABLET | Freq: Once | ORAL | Status: AC
  Administered 2023-04-30: 1 via ORAL
  Filled 2023-04-30: qty 1

## 2023-04-30 NOTE — Discharge Instructions (Addendum)
You can take 650 mg of Tylenol every 6 hours as needed for pain.  Please take the meloxicam once a day for 2 weeks even if your symptoms improve.  You can take the Flexeril 3 times a day as needed for muscle spasms.  Keep in mind this medication may make you feel sleepy, so do not drive after taking it.  I have also attached some exercises for you to do that will help improve your neck pain.  You can follow-up with your primary care provider if your symptoms are not getting any better, and they can consider starting a medication called gabapentin.

## 2023-04-30 NOTE — ED Triage Notes (Signed)
Pt here with neck pain. Pt states he hit his head getting in the car on Friday and is continuing to have pain when he turns his head. Pt states it is now painful to move his right hand. Pt A&O x4.

## 2023-04-30 NOTE — ED Provider Notes (Signed)
[facility] Provider Note    Event Date/Time   First MD Initiated Contact with Patient 04/30/23 1126     (approximate)   History   Neck Injury   HPI  Bradley Charles is a 82 y.o. male with PMH of hypertension presents for evaluation of neck pain.  Patient bumped his head getting into the car a few days ago and is unsure if this is what started his pain.  He has had progressively worsening neck pain and headache.  He also describes severe pain in his hands at his thumb.      Physical Exam   Triage Vital Signs: ED Triage Vitals  Encounter Vitals Group     BP 04/30/23 1039 107/62     Systolic BP Percentile --      Diastolic BP Percentile --      Pulse Rate 04/30/23 1039 67     Resp 04/30/23 1039 16     Temp 04/30/23 1040 99.1 F (37.3 C)     Temp Source 04/30/23 1040 Oral     SpO2 04/30/23 1039 95 %     Weight 04/30/23 1040 160 lb 0.9 oz (72.6 kg)     Height 04/30/23 1040 5\' 11"  (1.803 m)     Head Circumference --      Peak Flow --      Pain Score --      Pain Loc --      Pain Education --      Exclude from Growth Chart --     Most recent vital signs: Vitals:   04/30/23 1039 04/30/23 1040  BP: 107/62   Pulse: 67   Resp: 16   Temp:  99.1 F (37.3 C)  SpO2: 95%     General: Awake, no distress.  CV:  Good peripheral perfusion.  RRR. Resp:  Normal effort.  CTAB. Abd:  No distention.  Other:  No tenderness to palpation over cervical vertebral spines or paraspinous muscles.  Compression of the neck exacerbates symptoms.  Patient unable to perform ROM of neck due to his pain.  Very tender to palpation over the bilateral thenar eminence.  Patient unable to flex and extend his thumbs bilaterally.  Decreased grip strength bilaterally. Sensation intact across all dermatomes.  Negative carpal tunnel compression test.    ED Results / Procedures / Treatments   Labs (all labs ordered are listed, but only abnormal results are displayed) Labs  Reviewed - No data to display   RADIOLOGY  CT head and neck obtained, interpreted the images as well as reviewed the radiologist report.   PROCEDURES:  Critical Care performed: No  Procedures   MEDICATIONS ORDERED IN ED: Medications  HYDROcodone-acetaminophen (NORCO/VICODIN) 5-325 MG per tablet 1 tablet (1 tablet Oral Given 04/30/23 1206)     IMPRESSION / MDM / ASSESSMENT AND PLAN / ED COURSE  I reviewed the triage vital signs and the nursing notes.                              Differential diagnosis includes, but is not limited to, cervical radiculopathy, muscle strain, radial nerve palsy.  Patient's presentation is most consistent with acute complicated illness / injury requiring diagnostic workup.  CT head and neck obtained, I interpreted the images as well as reviewed the radiologist report.  There is no acute intracranial abnormality but he does have diffuse generalized atrophy.  There is a trace right mastoid  effusion.  Degenerative changes in cervical spine but no fracture or subluxation.  I believe the pain in patient's hands is originating from the neck, and I think this is from cervical muscle strain.  I will treat patient with an anti-inflammatory, a muscle relaxer and Tylenol.  I advised him to follow-up with his primary care provider if he is not getting better, and at that point they could consider starting him on gabapentin.  He can use topical pain relievers as well as ice and heat.  Patient was agreeable to plan, voiced understanding and all questions were answered.       FINAL CLINICAL IMPRESSION(S) / ED DIAGNOSES   Final diagnoses:  Strain of neck muscle, initial encounter     Rx / DC Orders   ED Discharge Orders          Ordered    meloxicam (MOBIC) 15 MG tablet  Daily        04/30/23 1317    cyclobenzaprine (FLEXERIL) 10 MG tablet  3 times daily PRN        04/30/23 1317             Note:  This document was prepared using Dragon voice  recognition software and may include unintentional dictation errors.   Cameron Ali, PA-C 04/30/23 1318    Willy Eddy, MD 04/30/23 1359

## 2023-04-30 NOTE — ED Notes (Signed)
See triage note  Presents with pain to neck and both hands  States he remembers hitting his head on car door on Friday Cont's to have pain to neck and into hand  Pain is worse on the right hand  Lower grade temp on arrival

## 2023-05-17 ENCOUNTER — Telehealth: Payer: Self-pay

## 2023-05-17 NOTE — Telephone Encounter (Signed)
Transition Care Management Unsuccessful Follow-up Telephone Call  Date of discharge and from where: [place]  9/1  Attempts:  1st Attempt  Reason for unsuccessful TCM follow-up call:  ED Follow up call    [facility]  [facility], [facility] Guide, Phone: [phone] Website: [email or web address]

## 2023-05-18 ENCOUNTER — Telehealth: Payer: Self-pay

## 2023-05-18 NOTE — Telephone Encounter (Signed)
Transition Care Management Unsuccessful Follow-up Telephone Call  Date of discharge and from where:  [place] 9/1  Attempts:  2nd Attempt  Reason for unsuccessful TCM follow-up call:  No answer/busy   [facility]  [facility], [facility] Guide, Phone: [phone] Website: [email or web address]

## 2023-05-24 DIAGNOSIS — Z23 Encounter for immunization: Secondary | ICD-10-CM | POA: Diagnosis not present

## 2023-05-31 DIAGNOSIS — R32 Unspecified urinary incontinence: Secondary | ICD-10-CM | POA: Diagnosis not present

## 2023-06-02 DIAGNOSIS — U071 COVID-19: Secondary | ICD-10-CM | POA: Diagnosis not present

## 2023-06-02 DIAGNOSIS — I10 Essential (primary) hypertension: Secondary | ICD-10-CM | POA: Diagnosis not present

## 2023-07-19 DIAGNOSIS — R32 Unspecified urinary incontinence: Secondary | ICD-10-CM | POA: Diagnosis not present

## 2023-07-19 DIAGNOSIS — G3184 Mild cognitive impairment, so stated: Secondary | ICD-10-CM | POA: Diagnosis not present

## 2023-07-19 DIAGNOSIS — R609 Edema, unspecified: Secondary | ICD-10-CM | POA: Diagnosis not present

## 2023-07-19 DIAGNOSIS — R001 Bradycardia, unspecified: Secondary | ICD-10-CM | POA: Diagnosis not present

## 2023-07-19 DIAGNOSIS — I10 Essential (primary) hypertension: Secondary | ICD-10-CM | POA: Diagnosis not present

## 2023-07-19 DIAGNOSIS — Z79899 Other long term (current) drug therapy: Secondary | ICD-10-CM | POA: Diagnosis not present

## 2023-07-31 ENCOUNTER — Ambulatory Visit: Payer: Medicare Other | Admitting: Urology

## 2023-12-07 DIAGNOSIS — D485 Neoplasm of uncertain behavior of skin: Secondary | ICD-10-CM | POA: Diagnosis not present

## 2023-12-11 DIAGNOSIS — H902 Conductive hearing loss, unspecified: Secondary | ICD-10-CM | POA: Diagnosis not present

## 2023-12-11 DIAGNOSIS — H6122 Impacted cerumen, left ear: Secondary | ICD-10-CM | POA: Diagnosis not present

## 2023-12-22 DIAGNOSIS — D044 Carcinoma in situ of skin of scalp and neck: Secondary | ICD-10-CM | POA: Diagnosis not present

## 2024-01-18 DIAGNOSIS — Z1331 Encounter for screening for depression: Secondary | ICD-10-CM | POA: Diagnosis not present

## 2024-01-18 DIAGNOSIS — I10 Essential (primary) hypertension: Secondary | ICD-10-CM | POA: Diagnosis not present

## 2024-01-18 DIAGNOSIS — Z139 Encounter for screening, unspecified: Secondary | ICD-10-CM | POA: Diagnosis not present

## 2024-01-18 DIAGNOSIS — Z23 Encounter for immunization: Secondary | ICD-10-CM | POA: Diagnosis not present

## 2024-01-18 DIAGNOSIS — D649 Anemia, unspecified: Secondary | ICD-10-CM | POA: Diagnosis not present

## 2024-01-18 DIAGNOSIS — Z9181 History of falling: Secondary | ICD-10-CM | POA: Diagnosis not present

## 2024-01-18 DIAGNOSIS — Z79899 Other long term (current) drug therapy: Secondary | ICD-10-CM | POA: Diagnosis not present

## 2024-01-18 DIAGNOSIS — R609 Edema, unspecified: Secondary | ICD-10-CM | POA: Diagnosis not present

## 2024-01-18 DIAGNOSIS — R32 Unspecified urinary incontinence: Secondary | ICD-10-CM | POA: Diagnosis not present

## 2024-01-18 DIAGNOSIS — G3184 Mild cognitive impairment, so stated: Secondary | ICD-10-CM | POA: Diagnosis not present

## 2024-01-18 DIAGNOSIS — R001 Bradycardia, unspecified: Secondary | ICD-10-CM | POA: Diagnosis not present

## 2024-02-08 DIAGNOSIS — L821 Other seborrheic keratosis: Secondary | ICD-10-CM | POA: Diagnosis not present

## 2024-02-08 DIAGNOSIS — D044 Carcinoma in situ of skin of scalp and neck: Secondary | ICD-10-CM | POA: Diagnosis not present

## 2024-02-28 DIAGNOSIS — H25813 Combined forms of age-related cataract, bilateral: Secondary | ICD-10-CM | POA: Diagnosis not present

## 2024-03-15 DIAGNOSIS — H25812 Combined forms of age-related cataract, left eye: Secondary | ICD-10-CM | POA: Diagnosis not present

## 2024-03-15 DIAGNOSIS — I10 Essential (primary) hypertension: Secondary | ICD-10-CM | POA: Diagnosis not present

## 2024-04-05 DIAGNOSIS — I10 Essential (primary) hypertension: Secondary | ICD-10-CM | POA: Diagnosis not present

## 2024-04-05 DIAGNOSIS — H25811 Combined forms of age-related cataract, right eye: Secondary | ICD-10-CM | POA: Diagnosis not present

## 2024-06-26 DIAGNOSIS — Z23 Encounter for immunization: Secondary | ICD-10-CM | POA: Diagnosis not present

## 2024-07-23 DIAGNOSIS — H6123 Impacted cerumen, bilateral: Secondary | ICD-10-CM | POA: Diagnosis not present

## 2024-07-23 DIAGNOSIS — G3184 Mild cognitive impairment, so stated: Secondary | ICD-10-CM | POA: Diagnosis not present

## 2024-07-23 DIAGNOSIS — I10 Essential (primary) hypertension: Secondary | ICD-10-CM | POA: Diagnosis not present

## 2024-07-23 DIAGNOSIS — R001 Bradycardia, unspecified: Secondary | ICD-10-CM | POA: Diagnosis not present

## 2024-07-23 DIAGNOSIS — R609 Edema, unspecified: Secondary | ICD-10-CM | POA: Diagnosis not present

## 2024-07-23 DIAGNOSIS — R7989 Other specified abnormal findings of blood chemistry: Secondary | ICD-10-CM | POA: Diagnosis not present

## 2024-07-23 DIAGNOSIS — R32 Unspecified urinary incontinence: Secondary | ICD-10-CM | POA: Diagnosis not present

## 2024-07-23 DIAGNOSIS — D649 Anemia, unspecified: Secondary | ICD-10-CM | POA: Diagnosis not present

## 2024-07-23 DIAGNOSIS — Z79899 Other long term (current) drug therapy: Secondary | ICD-10-CM | POA: Diagnosis not present
